# Patient Record
Sex: Female | Born: 2000 | Race: White | Marital: Single | ZIP: M9A | Smoking: Never smoker
Health system: Southern US, Community
[De-identification: ages and names within clinical notes are randomized; demographics above are authoritative.]

## PROBLEM LIST (undated history)

## (undated) DIAGNOSIS — Z789 Other specified health status: Secondary | ICD-10-CM

## (undated) HISTORY — DX: Other specified health status: Z78.9

## (undated) HISTORY — PX: HIP SURGERY: SHX245

## (undated) HISTORY — PX: NO PAST SURGERIES: SHX2092

---
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
## 2018-01-13 NOTE — Progress Notes (Signed)
Patient presents today with symptoms of right hip pain. Patient states that she has had bursitis in the past and it feels like that. She denies any trauma or injury to the area. She states that she felt the pain after doing a stretch n the weight room approximally 2 weeks ago. She denies any back pain or any radicular symptoms into her leg. She is not taking any medications consistently for her symptoms. She denies any fever or chills or any abnormal gait.  ROS: Negative except mentioned above. Vitals as per Epic GENERAL: NAD MSK: there is no obvious deformity of the right hip, there is no erythema or ecchymosis noted, there is mild tenderness on palpation at the greater trochanter, there is some mild tenderness in the gluteus medius muscle and proximal IT band, full range of motion of the hip and back, negative Fulcrum, positive Obers, no lumbar back tenderness, negative straight leg raise, normal gait, mildly tight hamstrings bilaterally, NV intact NEURO: CN II-XII grossly intact   A/P: Right lateral hip pain - likely muscular in origin, patient insistent on getting imaging so will do so, NSAIDs when necessary, discussed glute strengthening and IT band stretching, modify activity and then advance activity as tolerated, discussed above with trainer, patient will seek medical attention if any acute worsening or persistent issues.

## 2018-02-05 ENCOUNTER — Other Ambulatory Visit: Payer: Self-pay | Admitting: Family Medicine

## 2018-06-30 ENCOUNTER — Ambulatory Visit (INDEPENDENT_AMBULATORY_CARE_PROVIDER_SITE_OTHER): Payer: No Typology Code available for payment source | Admitting: Family Medicine

## 2018-06-30 VITALS — BP 105/55 | HR 69 | Temp 98.3°F | Resp 14

## 2018-06-30 DIAGNOSIS — G4700 Insomnia, unspecified: Secondary | ICD-10-CM

## 2018-07-01 NOTE — Progress Notes (Signed)
Patient presents today with history of sleep problem. She admits to problems since returning back to school. She denies any possible distractions in her room. She denies any anxiety. She denies being on her cell phone at night before going to bed. She denies exercising or eating right before going to bed. She sleeps in her own bed by herself. She has tried things to relax in her room before going to bed. She admits to lying in bed for a few hours before dosing off. She also notices herself getting up before her alarm goes off in the morning. She admits to snoring but not apnea. She is often tired during the day. She sometimes takes afternoon naps. She denies any weight or appetite issues. She denies feeling depressed. [facility]. She has tried Melatonin in the past to help her fall asleep but hasn't worked. She is unsure of the dose.  Has not tried any other sleep agents. Admits to normal menstrual cycles. Denies illicit drug use, smoking, daily alcohol use.   ROS: Negative except mentioned above. Vitals as per Epic.  GENERAL: NAD HEENT: no pharyngeal erythema, no exudate, no erythema of TMs, no cervical LAD RESP: CTA B CARD: RRR NEURO: CN II-XII grossly intact   A/P: Sleep Issues - recommend patient get back to me about the dosage of Melatonin she has used in the past, she may need to take a higher dose, also recommend trying warm milk at night, encouraged her not to watch tv or do homework on her bed before going to sleep, try to cut out daytime napping, stay on a normal cycle as much as possible (sleep and wake up times), if sleep issues persist after trying above will send patient for sleep study, she is to follow-up with me in 2-3 weeks, patient addresses understanding.

## 2018-07-19 ENCOUNTER — Ambulatory Visit (INDEPENDENT_AMBULATORY_CARE_PROVIDER_SITE_OTHER): Payer: No Typology Code available for payment source | Admitting: Family Medicine

## 2018-07-19 VITALS — Temp 98.7°F | Resp 14

## 2018-07-19 DIAGNOSIS — M25551 Pain in right hip: Secondary | ICD-10-CM

## 2018-07-21 ENCOUNTER — Ambulatory Visit (INDEPENDENT_AMBULATORY_CARE_PROVIDER_SITE_OTHER): Payer: PRIVATE HEALTH INSURANCE | Admitting: Family Medicine

## 2018-07-21 VITALS — Temp 99.8°F | Resp 14

## 2018-07-21 DIAGNOSIS — M7061 Trochanteric bursitis, right hip: Secondary | ICD-10-CM

## 2018-07-21 NOTE — Progress Notes (Addendum)
Procedure Note Right Trochanteric Bursitis- Risks/Benefits of the procedure were discussed with the patient, patient addressed understanding and gave verbal consent.  Sterile technique was used.  The two areas of maximal tenderness around the greater trochanter were identified and marked.  Ethyl chloride was used for local anesthetic.  A spinal needle was used to inject 6 cc of 1% Lidocaine and 40 mg of Kenalog into the two areas. Patient tolerated procedure well.  Encourage patient to ice the area 2-3 times today for 15 to 20 minutes.  Can take Tylenol 1000mg  for pain today if needed.  Monitor for any worsening symptoms.  No athletic activity for at least 48 hours.  Patient addressed understanding and before she left stated the area of pain was gone.  I told her this was likely due to the lidocaine that was injected.  Patient had no problems walking out of the exam room today.  Xylocaine 1% 20mg /ml  Lot [id number], Exp 3/23 (3 bottles) Kenalog 40mg /ml Lot [id number], Exp 8/20 (1 bottle)

## 2019-03-25 NOTE — Progress Notes (Signed)
Patient presents today for follow-up regarding her right lateral hip pain. Hx of bursitis in the past per patient. Her symptoms initially started in the spring. She has taken some time off and has done rehab. She has not had significant relief of symptoms. Her pain is along the posterior lateral hip area and is painful to palpation. She denies any back pain or groin pain. Has pain with running and when lying on right hip in bed. NSAIDs have not made a significant difference. Xrays and MRI were done in the spring.   ROS: Negative except mentioned above. Vitals as per Epic. GENERAL: NAD RESP: CTA B CARD: RRR MSK: mild tenderness along greater trochanter area and glute medius, FROM of right hip and back, -hop test, -Fulcrum, -SLR, nv intact, normal gait NEURO: CN II-XII grossly intact   A/P: R Lateral Hip Pain - discussed possibility of trochanteric bursitis given location of pain, patient would like to do a steroid injection to see if improvement in symptoms, will set up time to return for procedure, if no benefit would recommend seeing hip specialist for further evaluation and treatment. Patient addresses understanding. Discussed plan with trainer.

## 2019-05-13 IMAGING — CR DG HIP (WITH OR WITHOUT PELVIS) 2-3V*R*
1 series · 3 of 3 positions shown · non-contrast
Comparison: None.

CLINICAL DATA: Lateral right hip pain.  The patient runs  track.

EXAM:
DG HIP (WITH OR WITHOUT PELVIS) 2-3V RIGHT

[Series 1: dg hip unilat w or w/o pelvis 2-3 views  · non-contrast · 0.14mm/px · 3 of 3 slices shown]
[im 1/3]
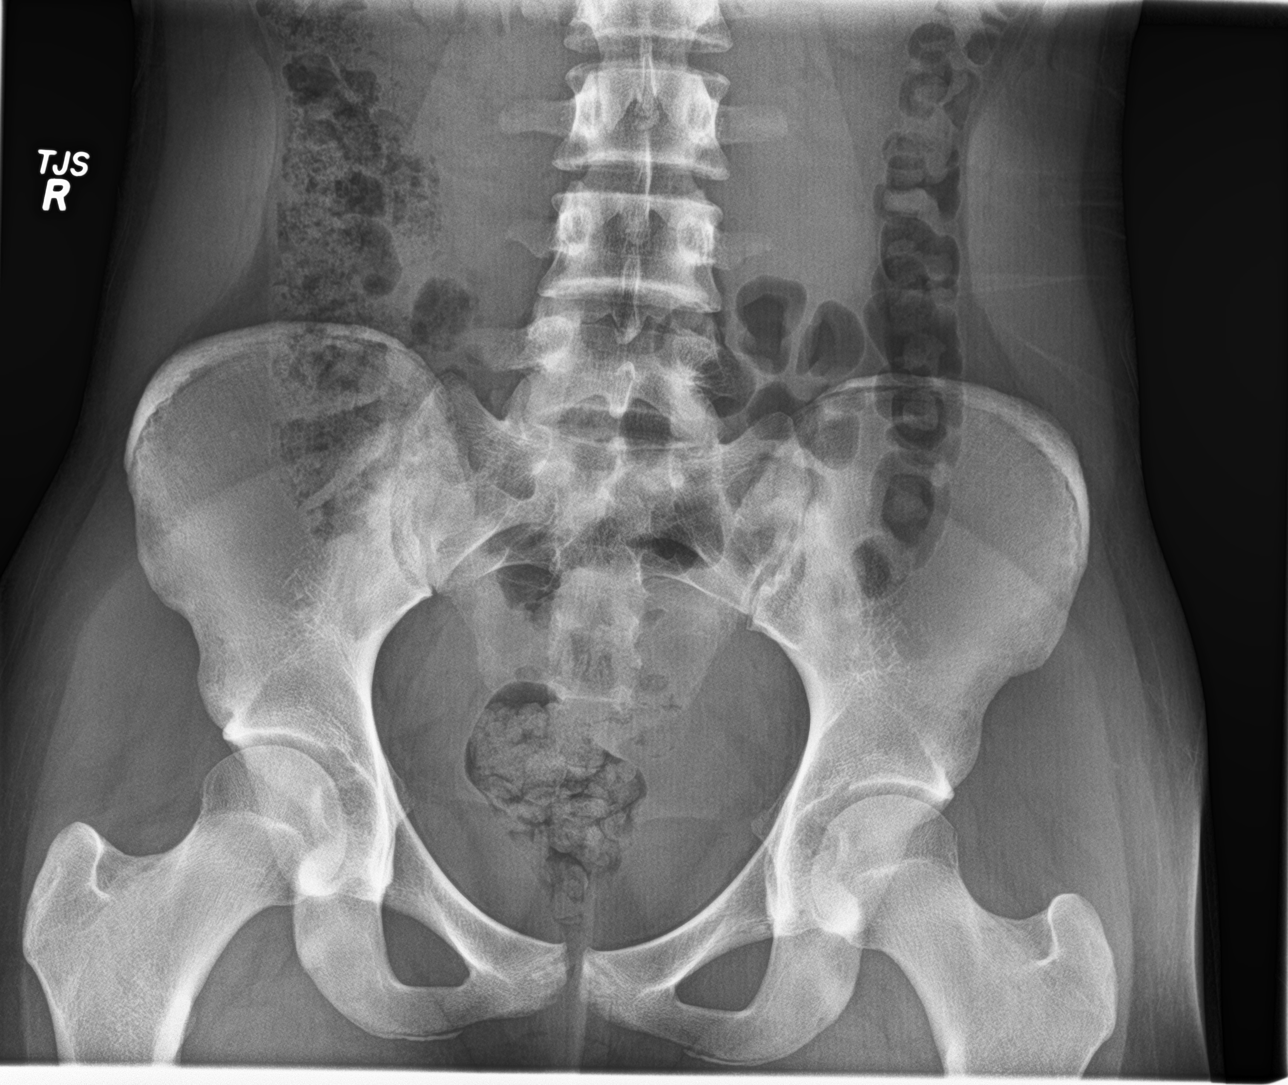
[im 2/3]
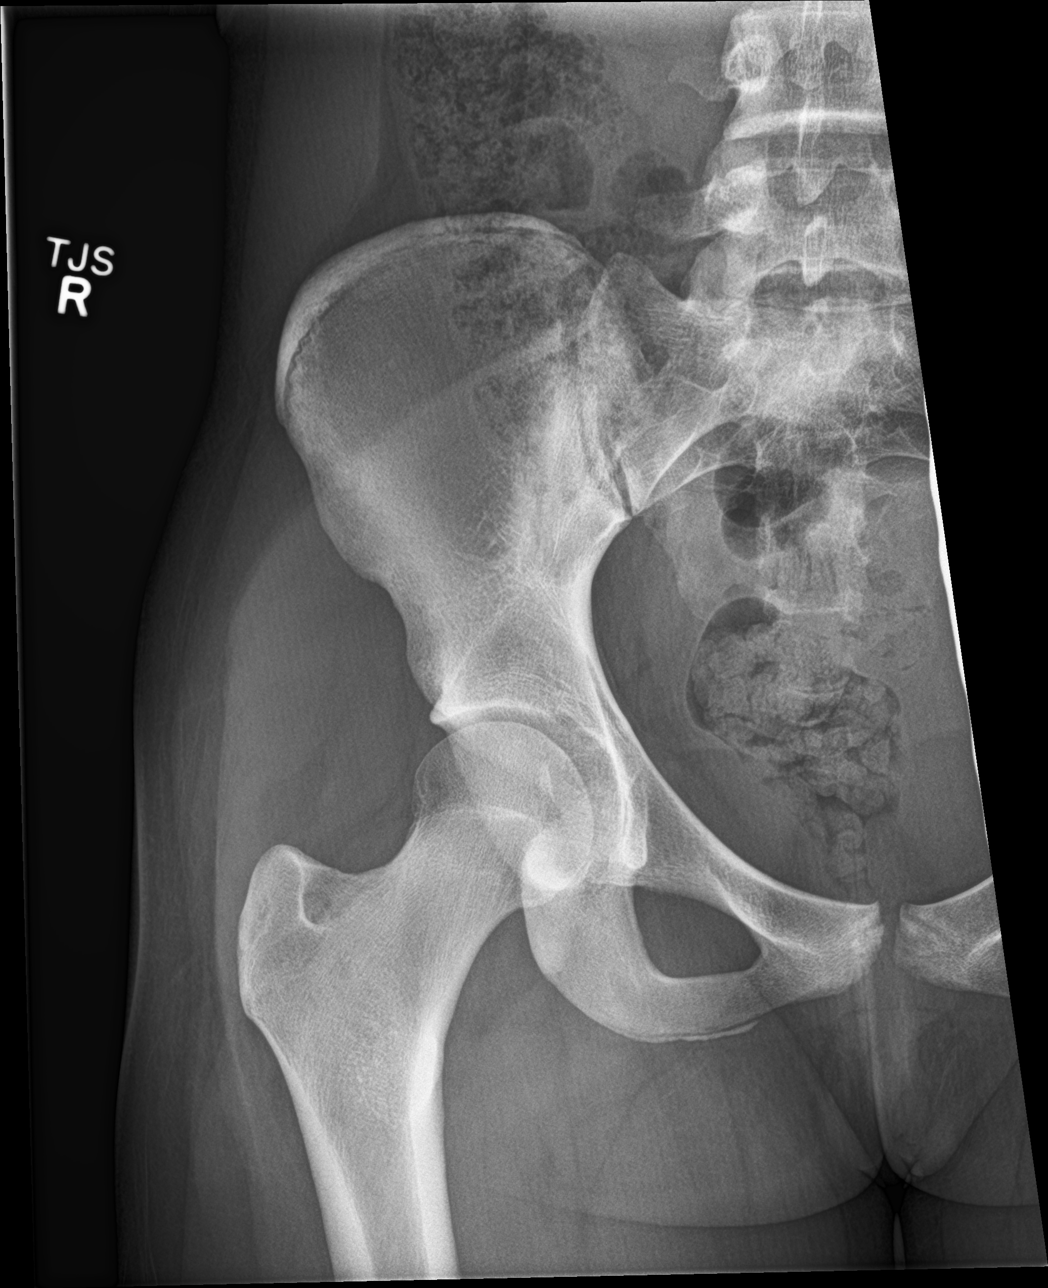
[im 3/3]
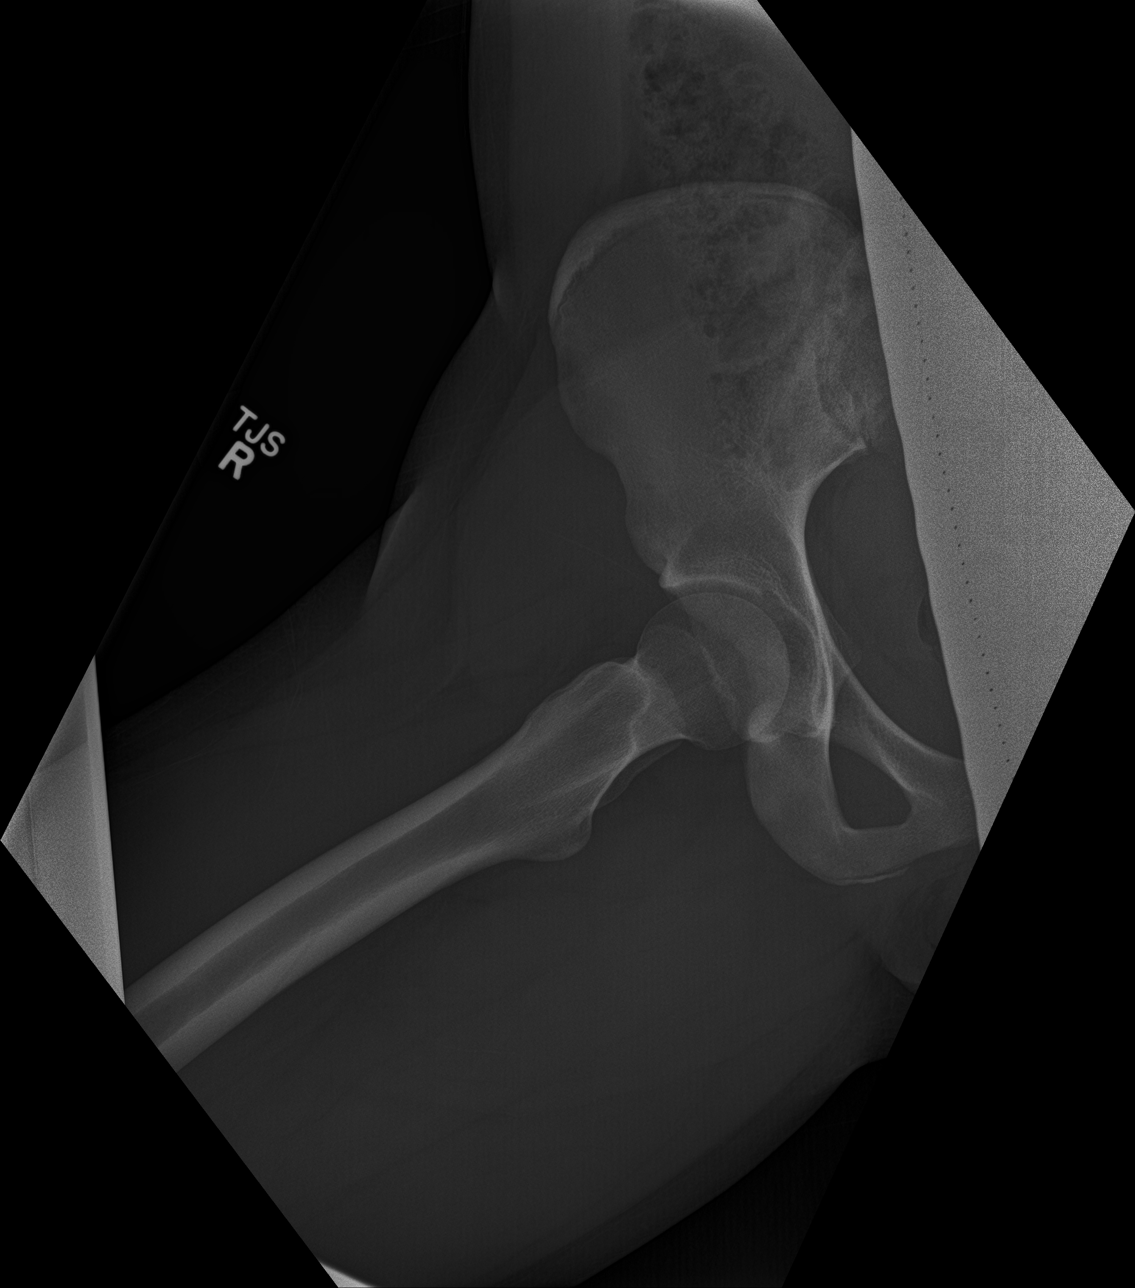

[3 of 3 positions shown; findings below may reference images not displayed]

FINDINGS: There is no evidence of hip fracture or dislocation. There is no
evidence of arthropathy or other focal bone abnormality.
IMPRESSION: Negative.

## 2019-06-28 ENCOUNTER — Other Ambulatory Visit: Payer: Self-pay | Admitting: *Deleted

## 2019-06-28 DIAGNOSIS — Z20822 Contact with and (suspected) exposure to covid-19: Secondary | ICD-10-CM

## 2019-06-29 LAB — NOVEL CORONAVIRUS, NAA: SARS-CoV-2, NAA: DETECTED — AB

## 2019-07-10 ENCOUNTER — Other Ambulatory Visit: Payer: Self-pay | Admitting: Family Medicine

## 2019-07-10 DIAGNOSIS — U071 COVID-19: Secondary | ICD-10-CM

## 2019-07-10 DIAGNOSIS — Z8616 Personal history of COVID-19: Secondary | ICD-10-CM

## 2019-07-10 DIAGNOSIS — Z8619 Personal history of other infectious and parasitic diseases: Secondary | ICD-10-CM

## 2019-07-10 DIAGNOSIS — I400 Infective myocarditis: Secondary | ICD-10-CM

## 2019-07-12 ENCOUNTER — Other Ambulatory Visit: Payer: No Typology Code available for payment source | Admitting: Family Medicine

## 2019-07-12 DIAGNOSIS — Z0000 Encounter for general adult medical examination without abnormal findings: Secondary | ICD-10-CM

## 2019-07-13 LAB — TROPONIN I: Troponin I: <0.01 ng/mL (ref 0.00–0.04)

## 2019-07-15 ENCOUNTER — Ambulatory Visit: Payer: No Typology Code available for payment source

## 2019-07-15 ENCOUNTER — Ambulatory Visit (INDEPENDENT_AMBULATORY_CARE_PROVIDER_SITE_OTHER): Payer: No Typology Code available for payment source

## 2019-07-15 ENCOUNTER — Other Ambulatory Visit: Payer: Self-pay

## 2019-07-15 DIAGNOSIS — Z8619 Personal history of other infectious and parasitic diseases: Secondary | ICD-10-CM

## 2019-07-15 DIAGNOSIS — I400 Infective myocarditis: Secondary | ICD-10-CM

## 2019-07-15 DIAGNOSIS — U071 COVID-19: Secondary | ICD-10-CM

## 2019-07-15 NOTE — Progress Notes (Signed)
Echocardiogram has been completed. 

## 2019-07-23 ENCOUNTER — Telehealth: Payer: Self-pay | Admitting: Family Medicine

## 2019-07-23 NOTE — Telephone Encounter (Signed)
Called and explained to patient that Cardiology has reviewed the ECG and Echocardiogram and has not informed me that any further cardiac workup is needed for patient to be able to resume physical activity. Patient is to seek medical attention if any cardiopulmonary symptoms develop once activity is resumed. Patient addresses understanding and has no further questions or concerns.  

## 2019-07-25 ENCOUNTER — Other Ambulatory Visit: Payer: No Typology Code available for payment source

## 2019-08-10 ENCOUNTER — Telehealth: Payer: No Typology Code available for payment source | Admitting: Family Medicine

## 2019-12-06 ENCOUNTER — Ambulatory Visit: Payer: PRIVATE HEALTH INSURANCE | Admitting: Obstetrics and Gynecology

## 2019-12-08 ENCOUNTER — Ambulatory Visit: Payer: PRIVATE HEALTH INSURANCE | Admitting: Obstetrics and Gynecology

## 2019-12-12 ENCOUNTER — Ambulatory Visit (INDEPENDENT_AMBULATORY_CARE_PROVIDER_SITE_OTHER): Payer: No Typology Code available for payment source | Admitting: Obstetrics and Gynecology

## 2019-12-12 ENCOUNTER — Other Ambulatory Visit: Payer: Self-pay

## 2019-12-12 ENCOUNTER — Other Ambulatory Visit (HOSPITAL_COMMUNITY)
Admission: RE | Admit: 2019-12-12 | Discharge: 2019-12-12 | Disposition: A | Discharge status: Home / Self Care | Payer: 59 | Source: Ambulatory Visit | Attending: Obstetrics and Gynecology | Admitting: Obstetrics and Gynecology

## 2019-12-12 ENCOUNTER — Encounter: Payer: Self-pay | Admitting: Obstetrics and Gynecology

## 2019-12-12 VITALS — BP 88/52 | HR 84 | Ht 65.0 in | Wt 141.4 lb

## 2019-12-12 DIAGNOSIS — Z124 Encounter for screening for malignant neoplasm of cervix: Secondary | ICD-10-CM | POA: Diagnosis present

## 2019-12-12 DIAGNOSIS — Z30430 Encounter for insertion of intrauterine contraceptive device: Secondary | ICD-10-CM | POA: Insufficient documentation

## 2019-12-12 DIAGNOSIS — Z3202 Encounter for pregnancy test, result negative: Secondary | ICD-10-CM

## 2019-12-12 LAB — POCT URINE PREGNANCY: Preg Test, Ur: NEGATIVE

## 2019-12-12 NOTE — Patient Instructions (Signed)
Intrauterine Device Information An intrauterine device (IUD) is a medical device that is inserted in the uterus to prevent pregnancy. It is a small, T-shaped device that has one or two nylon strings hanging down from it. The strings hang out of the lower part of the uterus (cervix) to allow for future IUD removal. There are two types of IUDs available:  Hormone IUD. This type of IUD is made of plastic and contains the hormone progestin (synthetic progesterone). A hormone IUD may last 3-5 years.  Copper IUD. This type of IUD has copper wire wrapped around it. A copper IUD may last up to 10 years. How is an IUD inserted? An IUD is inserted through the vagina and placed into the uterus with a minor medical procedure. The exact procedure for IUD insertion may vary among health care providers and hospitals. How does an IUD work? Synthetic progesterone in a hormonal IUD prevents pregnancy by:  Thickening cervical mucus to prevent sperm from entering the uterus.  Thinning the uterine lining to prevent a fertilized egg from being implanted there. Copper in a copper IUD prevents pregnancy by making the uterus and fallopian tubes produce a fluid that kills sperm. What are the advantages of an IUD? Advantages of either type of IUD  It is highly effective in preventing pregnancy.  It is reversible. You can become pregnant shortly after the IUD is removed.  It is low-maintenance and can stay in place for a long time.  There are no estrogen-related side effects.  It can be used when breastfeeding.  It is not associated with weight gain.  It can be inserted right after childbirth, an abortion, or a miscarriage. Advantages of a hormone IUD  If it is inserted within 7 days of your period starting, it works right after it is inserted. If the hormone IUD is inserted at any other time in your cycle, you will need to use a backup method of birth control for 7 days after insertion.  It can make  menstrual periods lighter.  It can reduce menstrual cramping.  It can be used for 3-5 years. Advantages of a copper IUD  It works right after it is inserted.  It can be used as a form of emergency birth control if it is inserted within 5 days after having unprotected sex.  It does not interfere with your body's natural hormones.  It can be used for 10 years. What are the disadvantages of an IUD?  An IUD may cause irregular menstrual bleeding for a period of time after insertion.  You may have pain during insertion and have cramping and vaginal bleeding after insertion.  An IUD may cut the uterus (uterine perforation) when it is inserted. This is rare.  An IUD may cause pelvic inflammatory disease (PID), which is an infection in the uterus and fallopian tubes. This is rare, and it usually happens during the first 20 days after the IUD is inserted.  A copper IUD can make your menstrual flow heavier and more painful. How is an IUD removed?  You will lie on your back with your knees bent and your feet in footrests (stirrups).  A device will be inserted into your vagina to spread apart the vaginal walls (speculum). This will allow your health care provider to see the strings attached to the IUD.  Your health care provider will use a small instrument (forceps) to grasp the IUD strings and pull firmly until the IUD is removed. You may have some discomfort   when the IUD is removed. Your health care provider may recommend taking over-the-counter pain relievers, such as ibuprofen, before the procedure. You may also have minor spotting for a few days after the procedure. The exact procedure for IUD removal may vary among health care providers and hospitals. Is the IUD right for me? Your health care provider will make sure you are a good candidate for an IUD and will discuss the advantages, disadvantages, and possible side effects with you. Summary  An intrauterine device (IUD) is a medical  device that is inserted in the uterus to prevent pregnancy. It is a small, T-shaped device that has one or two nylon strings hanging down from it.  A hormone IUD contains the hormone progestin (synthetic progesterone). A copper IUD has copper wire wrapped around it.  Synthetic progesterone in a hormone IUD prevents pregnancy by thickening cervical mucus and thinning the walls of the uterus. Copper in a copper IUD prevents pregnancy by making the uterus and fallopian tubes produce a fluid that kills sperm.  A hormone IUD can be left in place for 3-5 years. A copper IUD can be left in place for up to 10 years.  An IUD is inserted and removed by a health care provider. You may feel some pain during insertion and removal. Your health care provider may recommend taking over-the-counter pain medicine, such as ibuprofen, before an IUD procedure. This information is not intended to replace advice given to you by your health care provider. Make sure you discuss any questions you have with your health care provider. Document Revised: 09/10/2017 Document Reviewed: 10/27/2016 Elsevier Patient Education  2020 [employer] IUD PLACEMENT POST-PROCEDURE INSTRUCTIONS  1. You may take Ibuprofen, Aleve or Tylenol for pain if needed.  Cramping should resolve within in 24 hours.  2. You may have a small amount of spotting.  You should wear a mini pad for the next few days.  3. You may have intercourse after 24 hours.  If you using this for birth control, it is effective immediately.  4. You need to call if you have any pelvic pain, fever, heavy bleeding or foul smelling vaginal discharge.  Irregular bleeding is common the first several months after having an IUD placed. You do not need to call for this reason unless you are concerned.  5. Shower or bathe as normal  6. You should have a follow-up appointment in 4-8 weeks for a re-check to make sure you are not having any problems. 

## 2019-12-12 NOTE — Progress Notes (Signed)
     GYNECOLOGY OFFICE PROCEDURE NOTE  Gina Nielsen is a 19 y.o. G0P0000 here for [place] IUD insertion. Reports painful heavy periods, intermenstrual spotting on her current combined OCPs, concerns for endometriosis (notes family history). Has been on several different OCPs in the past with minimal relief of symptoms.  Desires to try an IUD as she notes her friends have noted good results.  Last pap smear was: Patient has not had a pap smear. Patient's last menstrual period was 11/10/2019.   Of note, patient was last seen in 2018 at  [facility]. Was seen in 2019 at [facility], and also has received some GYN care in [place] (where she is from) over the past 1-2 years.    IUD Insertion Procedure Note Patient identified, informed consent performed, consent signed.   Discussed risks of irregular bleeding, cramping, infection, malpositioning or misplacement of the IUD outside the uterus which may require further procedure such as laparoscopy. Also discussed >99% contraception efficacy, increased risk of ectopic pregnancy with failure of method.  Time out was performed.  Urine pregnancy test negative.  Speculum placed in the vagina.  Cervix visualized. Pap smear performed today as patient is due based on age-based screening guidelines. Cleaned with Betadine x 2.  Grasped anteriorly with a single tooth tenaculum.  Uterus sounded to 7.5 cm.  Kyleena IUD placed per manufacturer's recommendations.  Strings trimmed to 3 cm. Tenaculum was removed, good hemostasis noted.  Patient tolerated procedure well.   Patient was given post-procedure instructions.  She was advised to have backup contraception for one week.  Patient was also asked to check IUD strings periodically and follow up in 4 weeks for IUD check.   Lot #: [id number] Exp: 06/2021   Hildred Laser, MD [facility]

## 2019-12-12 NOTE — Progress Notes (Signed)
Pt present for IUD insertion. Pt stated that she wanted to change her form of BC due to her BCP was causing a lot of abnormal bleeding. Pt would like to get an IUD but unsure of which one she would like.  UPT-NEG.

## 2019-12-14 LAB — CYTOLOGY - PAP
Chlamydia: NEGATIVE
Comment: NEGATIVE
Comment: NORMAL
Diagnosis: NEGATIVE
Diagnosis: REACTIVE
Neisseria Gonorrhea: NEGATIVE

## 2020-01-09 NOTE — Progress Notes (Signed)
Pt present for IUD string check. Pt had COVID 19 vaccine.  Pt stated that she was doing well and having no issues with her IUD.

## 2020-01-10 ENCOUNTER — Encounter: Payer: Self-pay | Admitting: Obstetrics and Gynecology

## 2020-01-10 ENCOUNTER — Ambulatory Visit (INDEPENDENT_AMBULATORY_CARE_PROVIDER_SITE_OTHER): Payer: No Typology Code available for payment source | Admitting: Obstetrics and Gynecology

## 2020-01-10 ENCOUNTER — Other Ambulatory Visit: Payer: Self-pay

## 2020-01-10 VITALS — BP 94/61 | HR 71 | Ht 65.0 in | Wt 136.7 lb

## 2020-01-10 DIAGNOSIS — Z30431 Encounter for routine checking of intrauterine contraceptive device: Secondary | ICD-10-CM | POA: Diagnosis not present

## 2020-01-10 NOTE — Progress Notes (Signed)
    GYNECOLOGY OFFICE ENCOUNTER NOTE History:  19 y.o. G0P0000 here today for today for IUD string check; Kyleena  IUD was placed  12/12/2019. No complaints about the IUD, no concerning side effects.  The following portions of the patient's history were reviewed and updated as appropriate: allergies, current medications, past family history, past medical history, past social history, past surgical history and problem list. Last pap smear on 12/12/2019 was normal, negative HRHPV.  Review of Systems:  Pertinent items are noted in HPI.   Objective:  Physical Exam Blood pressure 94/61, pulse 71, height 5\' 5"  (1.651 m), weight 136 lb 11.2 oz (62 kg). CONSTITUTIONAL: Well-developed, well-nourished female in no acute distress.  ABDOMEN: Soft, no distention noted.   PELVIC: Normal appearing external genitalia; normal appearing vaginal mucosa and cervix.  IUD strings visualized, about 3.5 cm in length outside cervix.  EXTREMITIES: Non-tender, no edema or cyanosis NEUROLOGIC: Grossly intact  Assessment & Plan:  Patient to keep IUD in place for 5 years; can come in for removal if she desires pregnancy earlier or for any concerning side effects.   , MD [facility]
